# Patient Record
Sex: Male | Born: 2000
Health system: Southern US, Community
[De-identification: ages and names within clinical notes are randomized; demographics above are authoritative.]

---
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
## 2021-05-10 ENCOUNTER — Ambulatory Visit
Admission: EM | Admit: 2021-05-10 | Discharge: 2021-05-10 | Disposition: A | Discharge status: Home / Self Care | Payer: 59 | Attending: Emergency Medicine | Admitting: Emergency Medicine

## 2021-05-10 ENCOUNTER — Encounter: Payer: Self-pay | Admitting: Emergency Medicine

## 2021-05-10 ENCOUNTER — Ambulatory Visit (INDEPENDENT_AMBULATORY_CARE_PROVIDER_SITE_OTHER): Payer: 59

## 2021-05-10 DIAGNOSIS — M25531 Pain in right wrist: Secondary | ICD-10-CM

## 2021-05-10 DIAGNOSIS — W19XXXA Unspecified fall, initial encounter: Secondary | ICD-10-CM | POA: Diagnosis not present

## 2021-05-10 DIAGNOSIS — S52514A Nondisplaced fracture of right radial styloid process, initial encounter for closed fracture: Secondary | ICD-10-CM

## 2021-05-10 NOTE — Discharge Instructions (Addendum)
Wear splint until seen by orthopedic specialist, info below,   Follow up in 1-2 weeks for evaluation, can go to any orthopedic doctor you deem appropriate  Can use 600 mg of ibuprofen every 6 hours as needed for pain or swelling

## 2021-05-10 NOTE — ED Notes (Signed)
Patient refused arm splint due to having the same one.

## 2021-05-10 NOTE — ED Triage Notes (Signed)
Fall on outstretched hand Friday night. Right wrist now bruised and painful

## 2021-05-10 NOTE — ED Provider Notes (Signed)
[facility]    CSN: [id number] Arrival date & time: 05/10/21  1430      History   Chief Complaint Chief Complaint  Patient presents with   Arm Injury    HPI Ronald Schultz is a 20 y.o. male.   Patient presents with right wrist pain, swelling and limited movement for 4 days after falling and landing palm down of hand. Pain with extension, flexion and rotation of wrist. Denies numbness, tingling, prior injury or trauma. Has been wearing wrist brace for comfort,   History reviewed. No pertinent past medical history.  There are no problems to display for this patient.   History reviewed. No pertinent surgical history.     Home Medications    Prior to Admission medications   Not on File    Family History History reviewed. No pertinent family history.  Social History Social History   Tobacco Use   Smoking status: Some Days    Types: Cigarettes   Smokeless tobacco: Never     Allergies   Patient has no known allergies.   Review of Systems Review of Systems  Constitutional: Negative.   Respiratory: Negative.    Musculoskeletal:  Positive for joint swelling. Negative for arthralgias, back pain, gait problem, myalgias, neck pain and neck stiffness.  Skin: Negative.     Physical Exam Triage Vital Signs ED Triage Vitals  Enc Vitals Group     BP 05/10/21 1443 113/68     Pulse Rate 05/10/21 1443 76     Resp 05/10/21 1443 16     Temp 05/10/21 1443 98 F (36.7 C)     Temp Source 05/10/21 1443 Oral     SpO2 05/10/21 1443 95 %     Weight --      Height --      Head Circumference --      Peak Flow --      Pain Score 05/10/21 1444 3     Pain Loc --      Pain Edu? --      Excl. in GC? --    No data found.  Updated Vital Signs BP 113/68 (BP Location: Left Arm)   Pulse 76   Temp 98 F (36.7 C) (Oral)   Resp 16   SpO2 95%   Visual Acuity Right Eye Distance:   Left Eye Distance:   Bilateral Distance:    Right Eye Near:   Left  Eye Near:    Bilateral Near:     Physical Exam Constitutional:      Appearance: Normal appearance. He is normal weight.  HENT:     Head: Normocephalic.  Eyes:     Extraocular Movements: Extraocular movements intact.  Pulmonary:     Effort: Pulmonary effort is normal.  Musculoskeletal:       Arms:     Comments: Point tenderness and mild swelling at the distal end of radius, 2+ radial pulse, limited range of motion, elicits pain, no ecchymosis noted   Skin:    General: Skin is warm and dry.  Neurological:     Mental Status: He is alert and oriented to person, place, and time. Mental status is at baseline.  Psychiatric:        Mood and Affect: Mood normal.     UC Treatments / Results  Labs (all labs ordered are listed, but only abnormal results are displayed) Labs Reviewed - No data to display  EKG   Radiology No results found.  Procedures  Procedures (including critical care time)  Medications Ordered in UC Medications - No data to display  Initial Impression / Assessment and Plan / UC Course  I have reviewed the triage vital signs and the nursing notes.  Pertinent labs & imaging results that were available during my care of the patient were reviewed by me and considered in my medical decision making (see chart for details).  Clinical Course as of 05/10/21 1549  Mon May 10, 2021  1536 DG Wrist Complete Right [AW]    Clinical Course User Index [AW] Valinda Hoar, NP    Closed nondisplaced fracture of radius styloid   Confirmed by x-ray  Wear splint until seen by ortho, neurovascularly intact  Orthopedic follow up 1-2 weeks Otc medication for pain management  Final Clinical Impressions(s) / UC Diagnoses   Final diagnoses:  None   Discharge Instructions   None    ED Prescriptions   None    PDMP not reviewed this encounter.   Valinda Hoar, NP 05/10/21 1551

## 2021-11-08 IMAGING — DX DG WRIST COMPLETE 3+V*R*
4 series · 4 of 4 positions shown · non-contrast
Comparison: None.

CLINICAL DATA: Injury. Fall onto outstretched hand on [REDACTED]. Wrist
pain and bruising.

EXAM:
RIGHT WRIST - COMPLETE 3+ VIEW

[wrist pa (1 of 3)]
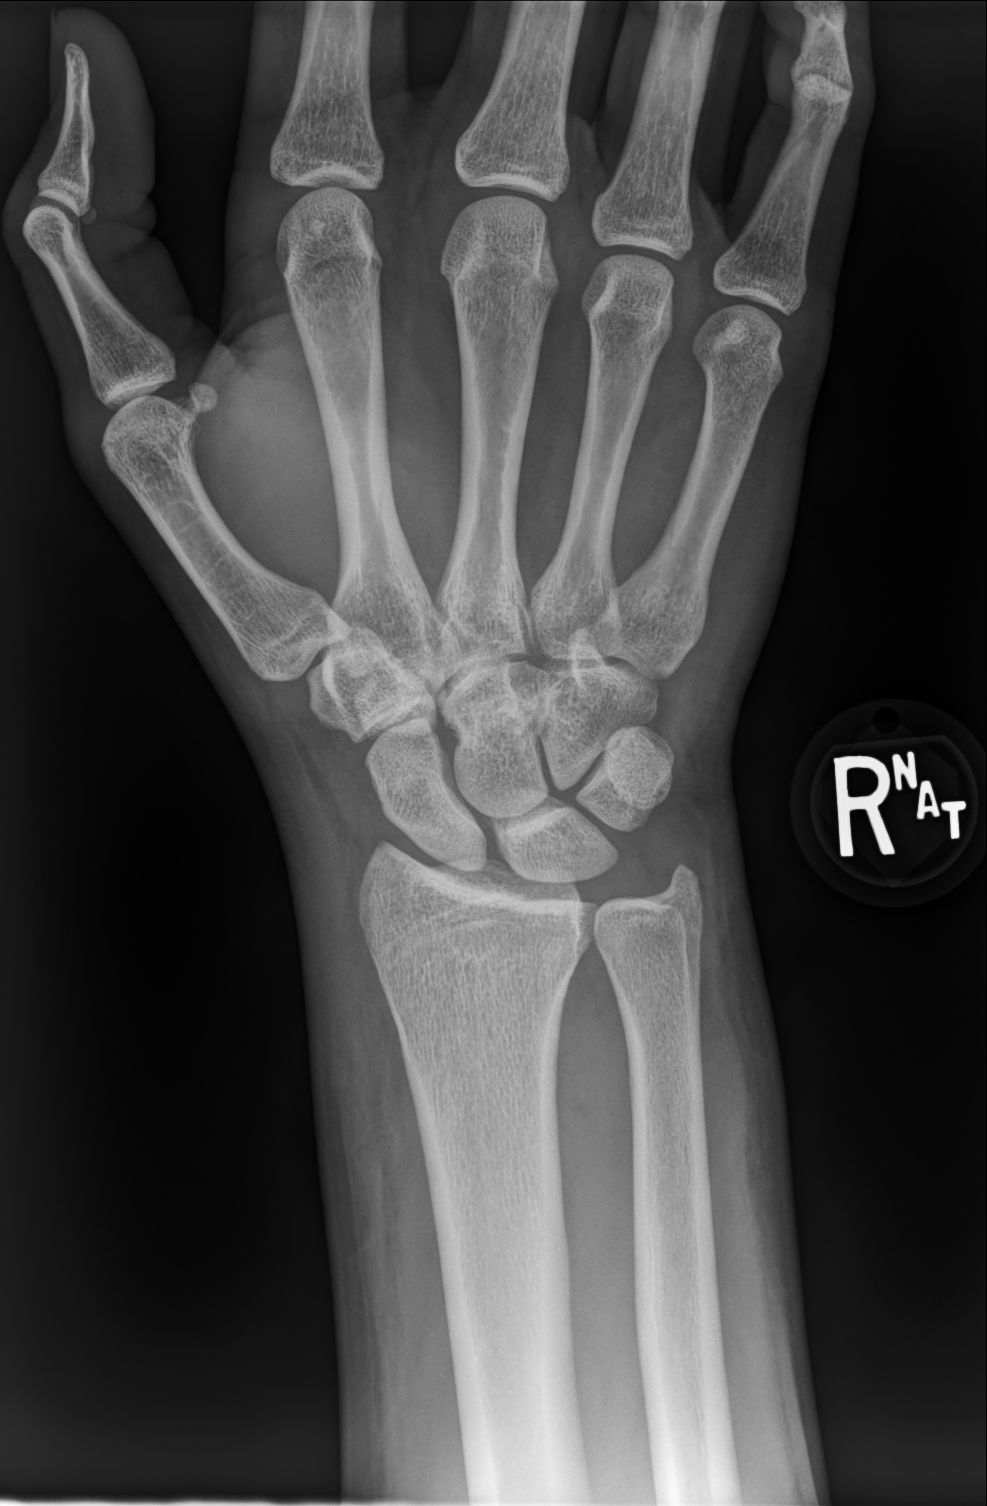

[wrist pa (2 of 3)]
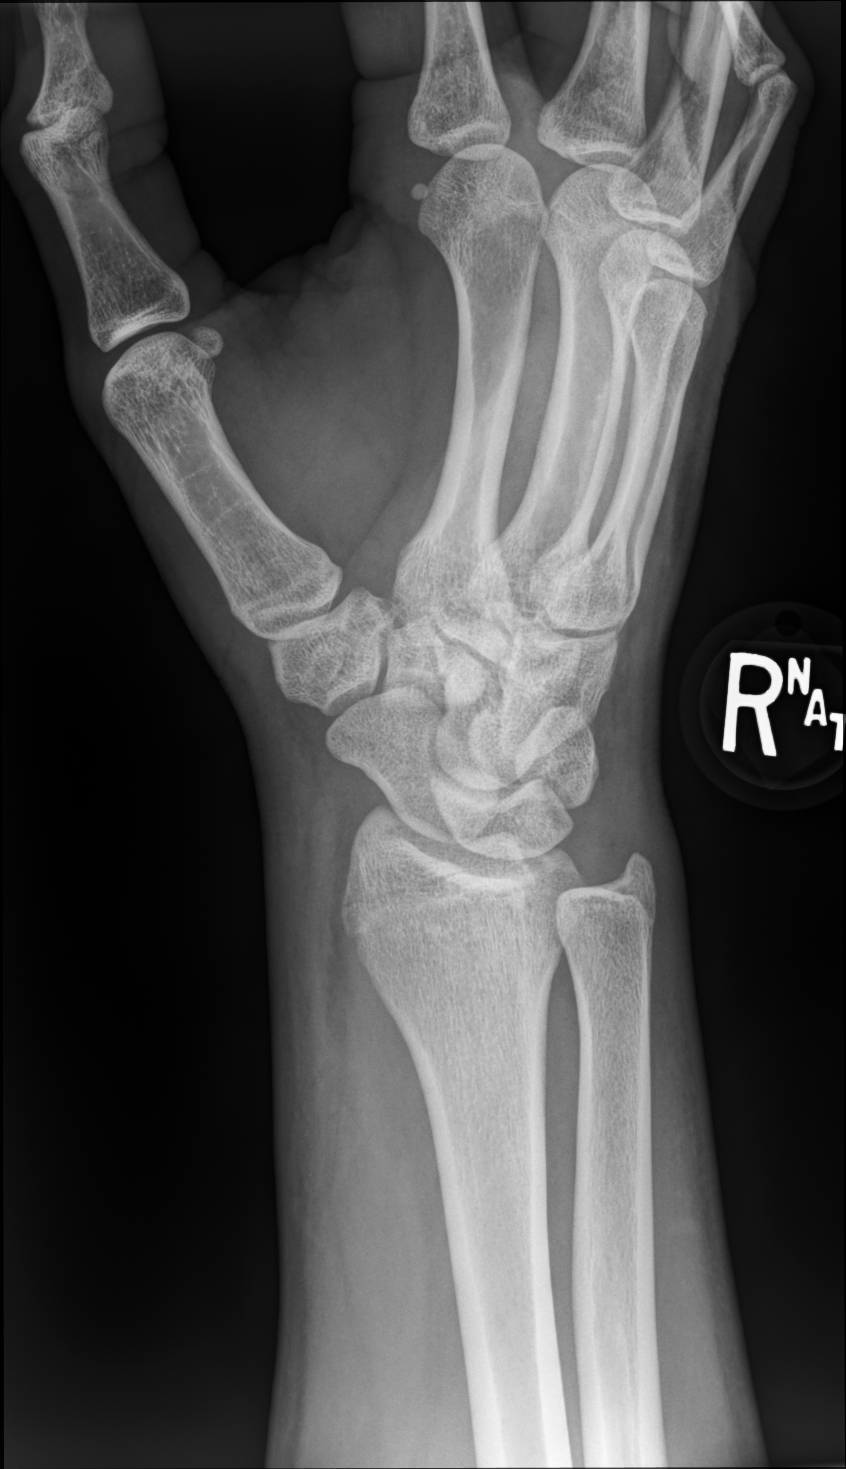

[wrist lat]
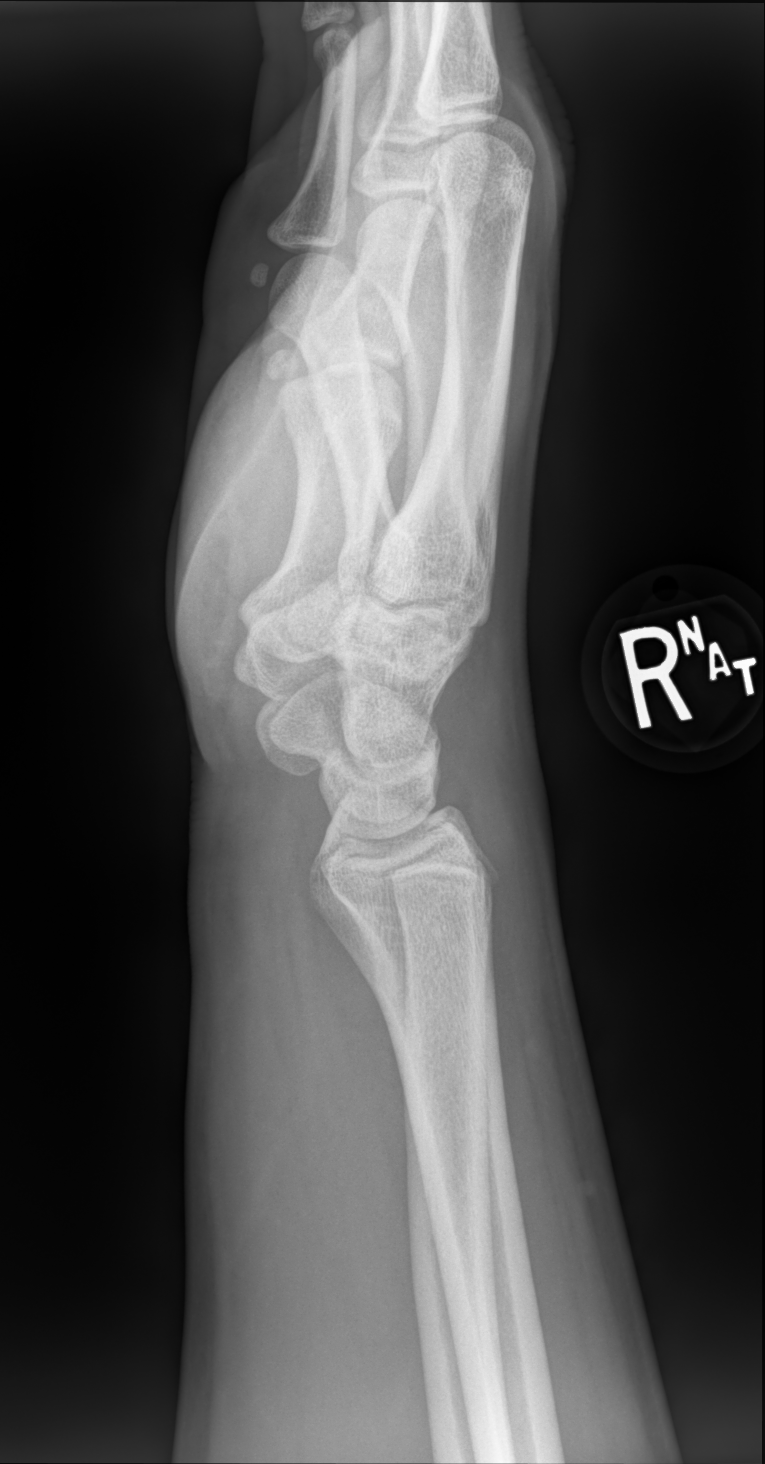

[wrist pa (3 of 3)]
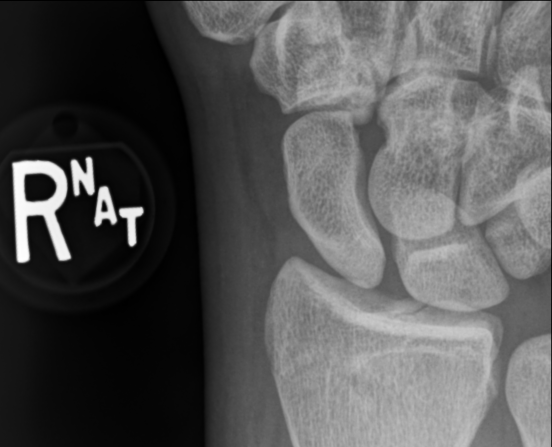

[4 of 4 positions shown; findings below may reference images not displayed]

FINDINGS: There is a nondisplaced fracture through the radial styloid,
extending to the radiocarpal joint space. No associated ulnar
fracture. The carpal bones are intact. Normal carpal alignment
including scapholunate alignment. Soft tissue edema noted about the
wrist.
IMPRESSION: Nondisplaced radial styloid fracture.
